# Patient Record
Sex: Female | Born: 1990 | Race: Black or African American | Hispanic: No | Smoking: Never smoker
Health system: Southern US, Community
[De-identification: ages and names within clinical notes are randomized; demographics above are authoritative.]

---
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
## 2011-10-18 ENCOUNTER — Emergency Department: Payer: Self-pay | Admitting: Internal Medicine

## 2011-10-22 ENCOUNTER — Emergency Department: Payer: Self-pay | Admitting: Emergency Medicine

## 2012-07-22 ENCOUNTER — Emergency Department: Payer: Self-pay | Admitting: Emergency Medicine

## 2012-07-22 LAB — URINALYSIS, COMPLETE
Bilirubin,UR: NEGATIVE
Glucose,UR: NEGATIVE mg/dL (ref 0–75)
Ketone: NEGATIVE
Ph: 7 (ref 4.5–8.0)
Protein: NEGATIVE
Specific Gravity: 1.02 (ref 1.003–1.030)
Squamous Epithelial: 8

## 2012-07-22 LAB — WET PREP, GENITAL

## 2012-10-11 ENCOUNTER — Emergency Department: Payer: Self-pay | Admitting: Emergency Medicine

## 2012-11-07 ENCOUNTER — Ambulatory Visit (INDEPENDENT_AMBULATORY_CARE_PROVIDER_SITE_OTHER): Payer: Managed Care, Other (non HMO) | Admitting: Adult Health

## 2012-11-07 ENCOUNTER — Encounter: Payer: Self-pay | Admitting: Adult Health

## 2012-11-07 VITALS — BP 110/76 | HR 66 | Temp 98.1°F | Resp 12 | Ht 63.5 in | Wt 193.5 lb

## 2012-11-07 DIAGNOSIS — Z0000 Encounter for general adult medical examination without abnormal findings: Secondary | ICD-10-CM | POA: Insufficient documentation

## 2012-11-07 NOTE — Assessment & Plan Note (Addendum)
Normal physical exam. Check labs: cbc w/diff, tsh, bmet, hepatic panel, lipids. First PAP in February at the [facility]. Request records.

## 2012-11-07 NOTE — Progress Notes (Signed)
  Subjective:    Patient ID: Madison Shaw, female    DOB: 06/01/1990, 22 y.o.   MRN: [id number]  HPI  Pt is pleasant 22 yo female who presents to clinic to establish care. Pt states she has never had a PCP. Pt would go to urgent care for any acute concerns. Pt [facility] Will [facility].    History reviewed. No pertinent past medical history.   History reviewed. No pertinent past surgical history.   Family History  Problem Relation Age of Onset  . Hypertension Mother   . Diabetes Mother   . Diabetes Father   . Hypertension Father      History   Social History  . Marital Status: Divorced    Spouse Name: N/A    Number of Children: N/A  . Years of Education: 13   Occupational History  . Customer Service [employer]   Social History Main Topics  . Smoking status: Never Smoker   . Smokeless tobacco: Never Used  . Alcohol Use: No  . Drug Use: No  . Sexual Activity: Yes    Birth Control/ Protection: Condom   Other Topics Concern  . Not on file   Social History Narrative   Pt is 22 yo female, lives alone.  Pt works in Clinical biochemist at [employer], states she will soon be starting school and plans to pursue a nursing degree.  Pt enjoys shopping in her free time.     Review of Systems  Constitutional: Negative.   HENT:       Pt reports history of tonsil stones.  Eyes: Negative.   Respiratory: Negative.   Cardiovascular: Negative.   Gastrointestinal: Negative.   Genitourinary: Positive for menstrual problem.       Reports heavy, irregular menstrual cycles  Skin: Negative.   Psychiatric/Behavioral: Negative.        Objective:   Physical Exam  Constitutional: She is oriented to person, place, and time. She appears well-developed and well-nourished.  HENT:  Head: Normocephalic and atraumatic.  Right Ear: Tympanic membrane, external ear and ear canal normal.  Left Ear: Tympanic membrane, external ear and  ear canal normal.  Mouth/Throat: Oropharyngeal exudate present.  Small white patches noted on tonsils  Eyes: Pupils are equal, round, and reactive to light.  Neck: Normal range of motion. Neck supple.  Cardiovascular: Normal rate, regular rhythm, normal heart sounds and intact distal pulses.   Pulmonary/Chest: Effort normal and breath sounds normal.  Abdominal: Soft. Bowel sounds are normal.  Musculoskeletal: Normal range of motion.  Neurological: She is alert and oriented to person, place, and time. She has normal strength and normal reflexes. She displays a negative Romberg sign.  Skin: Skin is warm and dry.  Psychiatric: She has a normal mood and affect. Her behavior is normal. Judgment and thought content normal.    BP 110/76  Pulse 66  Temp(Src) 98.1 F (36.7 C) (Oral)  Resp 12  Ht 5' 3.5" (1.613 m)  Wt 193 lb 8 oz (87.771 kg)  BMI 33.74 kg/m2  SpO2 95%     Assessment & Plan:

## 2012-11-07 NOTE — Patient Instructions (Addendum)
   Thank you for choosing [place] [facility] for your health care needs.  Please have your labs drawn at [facility] and have the results sent to my office.  The results will be available through MyChart for your convenience. Please remember to activate this. The activation code is located at the end of this form.  Set up a follow up appointment for your health physical prior to school. Fill out as much of the form as possible prior to coming to the office and we will fill out the portion that corresponds to [place].

## 2012-11-08 ENCOUNTER — Telehealth: Payer: Self-pay | Admitting: Adult Health

## 2012-11-08 DIAGNOSIS — D75839 Thrombocytosis, unspecified: Secondary | ICD-10-CM

## 2012-11-08 DIAGNOSIS — D473 Essential (hemorrhagic) thrombocythemia: Secondary | ICD-10-CM

## 2012-11-08 NOTE — Telephone Encounter (Signed)
Slightly elevated platelet count. All other labs look good. Want to repeat a CBC in 2-3 months.

## 2012-11-08 NOTE — Telephone Encounter (Signed)
Called and advised patient of results

## 2012-11-21 ENCOUNTER — Encounter: Payer: Self-pay | Admitting: Adult Health

## 2013-01-09 ENCOUNTER — Encounter: Payer: Managed Care, Other (non HMO) | Admitting: Adult Health

## 2013-01-20 ENCOUNTER — Ambulatory Visit (INDEPENDENT_AMBULATORY_CARE_PROVIDER_SITE_OTHER): Payer: Self-pay | Admitting: Family Medicine

## 2013-01-20 ENCOUNTER — Encounter: Payer: Self-pay | Admitting: Family Medicine

## 2013-01-20 VITALS — BP 132/86 | HR 86 | Temp 98.3°F | Wt 201.2 lb

## 2013-01-20 DIAGNOSIS — N926 Irregular menstruation, unspecified: Secondary | ICD-10-CM

## 2013-01-20 LAB — POCT URINE PREGNANCY: Preg Test, Ur: NEGATIVE

## 2013-01-20 NOTE — Progress Notes (Signed)
Pre-visit discussion using our clinic review tool. No additional management support is needed unless otherwise documented below in the visit note.  LMP 12/02/12.  Normal period at the time.  Hasn't missed a period until now, usually regular.  About 3 weeks over.  No on birth control. Neg preg test at night about 5 days ago.  Breast are slightly sore, but this isn't new for patient.   She is safe at home.  Sexually active, unprotected.  Pap was normal per patient 2014.  No VB of any variety recently.  No cramping or abd pain.  No PMS sx. No FCNAVD.     Not smoking, no etoh, no illicits.    Meds, vitals, and allergies reviewed.   ROS: See HPI.  Otherwise, noncontributory.  nad ncat rrr ctab abd soft Ext w/o edema

## 2013-01-20 NOTE — Patient Instructions (Signed)
I would recheck a home test in about 4-5 days, in the morning.  If you need help, then notify your regular clinic. Take care.

## 2013-01-20 NOTE — Assessment & Plan Note (Addendum)
Repeat upreg neg here today.  Missed a single period.  Would recheck a home test in about 4-5 days, then fu with PCP if needed.  She agrees.  Continue MVI for now.  Would continue to avoid illicits, tob, etoh. She agrees.  Okay for outpatient f/u.

## 2013-06-21 ENCOUNTER — Encounter: Payer: Self-pay | Admitting: Adult Health

## 2014-10-17 ENCOUNTER — Telehealth: Payer: Self-pay | Admitting: *Deleted

## 2014-10-17 NOTE — Telephone Encounter (Signed)
Pt called requesting an appoint because she does not feel well today.  I attempted to return call to [phone] the line had been disconnected.  I also attempted to call number listed in chart as home/cell wrong number.  I then attempted to call emergency contact that number had been disconnected as well.

## 2014-10-18 ENCOUNTER — Ambulatory Visit: Payer: Self-pay | Admitting: Nurse Practitioner

## 2014-11-06 ENCOUNTER — Encounter: Payer: Self-pay | Admitting: Nurse Practitioner

## 2014-11-06 ENCOUNTER — Ambulatory Visit (INDEPENDENT_AMBULATORY_CARE_PROVIDER_SITE_OTHER): Payer: BC Managed Care – PPO | Admitting: Nurse Practitioner

## 2014-11-06 VITALS — BP 118/90 | HR 76 | Temp 98.6°F | Resp 14 | Ht 64.0 in | Wt 190.0 lb

## 2014-11-06 DIAGNOSIS — Z0000 Encounter for general adult medical examination without abnormal findings: Secondary | ICD-10-CM | POA: Diagnosis not present

## 2014-11-06 DIAGNOSIS — Z113 Encounter for screening for infections with a predominantly sexual mode of transmission: Secondary | ICD-10-CM

## 2014-11-06 DIAGNOSIS — Z01419 Encounter for gynecological examination (general) (routine) without abnormal findings: Secondary | ICD-10-CM

## 2014-11-06 DIAGNOSIS — N926 Irregular menstruation, unspecified: Secondary | ICD-10-CM | POA: Diagnosis not present

## 2014-11-06 DIAGNOSIS — Z8639 Personal history of other endocrine, nutritional and metabolic disease: Secondary | ICD-10-CM

## 2014-11-06 LAB — COMPREHENSIVE METABOLIC PANEL
ALK PHOS: 64 U/L (ref 39–117)
ALT: 11 U/L (ref 0–35)
AST: 14 U/L (ref 0–37)
Albumin: 4.4 g/dL (ref 3.5–5.2)
BILIRUBIN TOTAL: 0.4 mg/dL (ref 0.2–1.2)
BUN: 9 mg/dL (ref 6–23)
CO2: 29 meq/L (ref 19–32)
CREATININE: 0.72 mg/dL (ref 0.40–1.20)
Calcium: 9.6 mg/dL (ref 8.4–10.5)
Chloride: 102 mEq/L (ref 96–112)
GFR: 128.18 mL/min (ref >60.00)
GLUCOSE: 84 mg/dL (ref 70–99)
Potassium: 3.8 mEq/L (ref 3.5–5.1)
Sodium: 138 mEq/L (ref 135–145)
TOTAL PROTEIN: 7.3 g/dL (ref 6.0–8.3)

## 2014-11-06 LAB — LIPID PANEL
Cholesterol: 149 mg/dL (ref 0–200)
HDL: 48.5 mg/dL (ref >39.00)
LDL CALC: 92 mg/dL (ref 0–99)
NONHDL: 100.5
Total CHOL/HDL Ratio: 3
Triglycerides: 43 mg/dL (ref 0.0–149.0)
VLDL: 8.6 mg/dL (ref 0.0–40.0)

## 2014-11-06 LAB — CBC WITH DIFFERENTIAL/PLATELET
Basophils Absolute: 0 10*3/uL (ref 0.0–0.1)
Basophils Relative: 0.3 % (ref 0.0–3.0)
EOS ABS: 0.1 10*3/uL (ref 0.0–0.7)
Eosinophils Relative: 0.9 % (ref 0.0–5.0)
HEMATOCRIT: 35.2 % — AB (ref 36.0–46.0)
Hemoglobin: 11.6 g/dL — ABNORMAL LOW (ref 12.0–15.0)
LYMPHS ABS: 3.1 10*3/uL (ref 0.7–4.0)
LYMPHS PCT: 53.9 % — AB (ref 12.0–46.0)
MCHC: 32.9 g/dL (ref 30.0–36.0)
MCV: 86 fl (ref 78.0–100.0)
MONOS PCT: 5 % (ref 3.0–12.0)
Monocytes Absolute: 0.3 10*3/uL (ref 0.1–1.0)
NEUTROS ABS: 2.3 10*3/uL (ref 1.4–7.7)
NEUTROS PCT: 39.9 % — AB (ref 43.0–77.0)
PLATELETS: 380 10*3/uL (ref 150.0–400.0)
RBC: 4.1 Mil/uL (ref 3.87–5.11)
RDW: 15 % (ref 11.5–15.5)
WBC: 5.8 10*3/uL (ref 4.0–10.5)

## 2014-11-06 LAB — HEMOGLOBIN A1C: Hgb A1c MFr Bld: 5 % (ref 4.6–6.5)

## 2014-11-06 LAB — POCT URINE PREGNANCY: PREG TEST UR: NEGATIVE

## 2014-11-06 LAB — T4, FREE: FREE T4: 0.9 ng/dL (ref 0.60–1.60)

## 2014-11-06 LAB — TSH: TSH: 1.09 u[IU]/mL (ref 0.35–4.50)

## 2014-11-06 NOTE — Patient Instructions (Signed)

## 2014-11-06 NOTE — Progress Notes (Signed)
Pre visit review using our clinic review tool, if applicable. No additional management support is needed unless otherwise documented below in the visit note. 

## 2014-11-06 NOTE — Progress Notes (Signed)
Patient ID: Madison Shaw, female    DOB: Apr 11, 1990  Age: 24 y.o. MRN: [id number]  CC: Annual Exam   HPI Madison Shaw presents for annual physical and CC of irregular menstruation.   1) Health Maintenance-   Diet- Healthy meals with portions, 1800 cal a day goal  Exercise- 2-3 hrs 6 days a week    Lost 30 lbs in 3 months  Immunizations- Flu last Friday   Pap- Due next year  Eye Exam- UTD  Dental Exam- UTD  STD testing requested-    LMP- Last week 10/28/14  2) Acute Problems-  Irregular Menses- 1 period since May  Lasted 1 week, heavy, throwing up, passing out, first two days very sick, bad cramps  Hair growth chest, under chin, gained a lot of weight last year since she was in school.   Uses condoms inconsistently- June last sexual encounter          History Madison Shaw has no past medical history on file.   She has no past surgical history on file.   Her family history includes Diabetes in her father and mother; Hypertension in her father and mother.She reports that she has never smoked. She has never used smokeless tobacco. She reports that she drinks alcohol. She reports that she does not use illicit drugs.  Outpatient Prescriptions Prior to Visit  Medication Sig Dispense Refill  . Multiple Vitamin (MULTIVITAMIN) capsule Take 1 capsule by mouth daily.     No facility-administered medications prior to visit.    ROS Review of Systems  Constitutional: Negative for fever, chills, diaphoresis, fatigue and unexpected weight change.  HENT: Negative for tinnitus and trouble swallowing.   Gastrointestinal: Negative for nausea, vomiting, abdominal pain, diarrhea, constipation and blood in stool.  Endocrine: Negative for polydipsia, polyphagia and polyuria.  Genitourinary: Negative for dysuria, hematuria, vaginal discharge and vaginal pain.  Musculoskeletal: Negative for myalgias, back pain, arthralgias and gait problem.  Skin: Negative for color change and rash.   Neurological: Negative for dizziness, weakness, numbness and headaches.  Hematological: Does not bruise/bleed easily.  Psychiatric/Behavioral: Negative for suicidal ideas and sleep disturbance. The patient is not nervous/anxious.     Objective:  BP 118/90 mmHg  Pulse 76  Temp(Src) 98.6 F (37 C)  Resp 14  Ht 5\' 4"  (1.626 m)  Wt 190 lb (86.183 kg)  BMI 32.60 kg/m2  SpO2 99%  Physical Exam  Constitutional: She is oriented to person, place, and time. She appears well-developed and well-nourished. No distress.  HENT:  Head: Normocephalic and atraumatic.  Right Ear: External ear normal.  Left Ear: External ear normal.  Nose: Nose normal.  Mouth/Throat: Oropharynx is clear and moist. No oropharyngeal exudate.  TMs and canals clear bilaterally  Eyes: Conjunctivae and EOM are normal. Pupils are equal, round, and reactive to light. Right eye exhibits no discharge. Left eye exhibits no discharge. No scleral icterus.  Neck: Normal range of motion. Neck supple. No thyromegaly present.  Cardiovascular: Normal rate, regular rhythm and normal heart sounds.   Pulmonary/Chest: Effort normal and breath sounds normal. No respiratory distress. She has no wheezes. She has no rales. She exhibits no tenderness.  Abdominal: Soft. Bowel sounds are normal. She exhibits no distension and no mass. There is no tenderness. There is no rebound and no guarding.  Musculoskeletal: Normal range of motion. She exhibits no edema or tenderness.  Lymphadenopathy:    She has no cervical adenopathy.  Neurological: She is alert and oriented to person, place, and  time. She has normal reflexes. No cranial nerve deficit. She exhibits normal muscle tone. Coordination normal.  Skin: Skin is warm and dry. No rash noted. She is not diaphoretic. No erythema. No pallor.  Psychiatric: She has a normal mood and affect. Her behavior is normal. Judgment and thought content normal.   Assessment & Plan:   Madison Shaw was seen today for  annual exam.  Diagnoses and all orders for this visit:  Encounter for routine gynecological examination -     POCT urine pregnancy  Routine general medical examination at a health care facility -     CBC with Differential/Platelet -     Comprehensive metabolic panel -     Hemoglobin A1c -     Lipid panel -     TSH  H/O thyroid disease -     T4, free  Screen for STD (sexually transmitted disease) -     STD Panel (HBSAG,HIV,RPR) -     GC/chlamydia probe amp, urine  Missed period   I am having Ms. Madison Shaw maintain her multivitamin.  No orders of the defined types were placed in this encounter.     Follow-up: Return in about 4 weeks (around 12/04/2014).

## 2014-11-07 ENCOUNTER — Other Ambulatory Visit: Payer: Self-pay | Admitting: Nurse Practitioner

## 2014-11-07 LAB — GC/CHLAMYDIA PROBE AMP, URINE
Chlamydia, Swab/Urine, PCR: NEGATIVE
GC Probe Amp, Urine: NEGATIVE

## 2014-11-07 LAB — STD PANEL
HIV 1&2 Ab, 4th Generation: NONREACTIVE
Hepatitis B Surface Ag: NEGATIVE

## 2014-11-07 MED ORDER — NORETHIN ACE-ETH ESTRAD-FE 1.5-30 MG-MCG PO TABS: 1.0000 | ORAL_TABLET | Freq: Every day | ORAL | Status: DC

## 2014-11-09 ENCOUNTER — Encounter: Payer: Self-pay | Admitting: Nurse Practitioner

## 2014-11-09 NOTE — Assessment & Plan Note (Addendum)
POCT urine pregnancy obtained. Possibly due to PCOS due to acne and hirsutism and irregular periods

## 2014-11-09 NOTE — Assessment & Plan Note (Signed)
Discussed acute and chronic issues. Reviewed health maintenance measures, PFSHx, and immunizations. Obtain routine labs TSH, Lipid panel, CBC w/ diff, A1c, and CMET.   STD panel is requested. Will add gc/chlamydia urine.

## 2015-01-23 ENCOUNTER — Encounter: Payer: Self-pay | Admitting: Nurse Practitioner

## 2015-01-25 ENCOUNTER — Other Ambulatory Visit: Payer: Self-pay | Admitting: Nurse Practitioner

## 2015-01-25 MED ORDER — NORETHIN ACE-ETH ESTRAD-FE 1.5-30 MG-MCG PO TABS: 1.0000 | ORAL_TABLET | Freq: Every day | ORAL | Status: DC

## 2015-02-04 ENCOUNTER — Encounter: Payer: Self-pay | Admitting: Nurse Practitioner

## 2015-02-04 NOTE — Telephone Encounter (Signed)
Please advise./tvw 

## 2015-02-05 ENCOUNTER — Telehealth: Payer: Self-pay | Admitting: *Deleted

## 2015-02-05 NOTE — Telephone Encounter (Signed)
Patient was last seen in Sept of this year please advise for labs?

## 2015-02-05 NOTE — Telephone Encounter (Signed)
Patient requested to have HPV testing done. She was not sure of the specifics of testing that could be done. Please Advise  She requested orders for testing to be put into epic,so that she could have labs drawn at her office.

## 2015-02-05 NOTE — Telephone Encounter (Signed)
I have responded to the mychart message she sent with additional questions. If she could respond back to that it would be helpful

## 2015-02-14 ENCOUNTER — Ambulatory Visit: Payer: BC Managed Care – PPO | Admitting: Nurse Practitioner

## 2016-03-26 ENCOUNTER — Emergency Department: Payer: BLUE CROSS/BLUE SHIELD

## 2016-03-26 ENCOUNTER — Emergency Department
Admission: EM | Admit: 2016-03-26 | Discharge: 2016-03-26 | Disposition: A | Discharge status: Home / Self Care | Payer: BLUE CROSS/BLUE SHIELD | Attending: Emergency Medicine | Admitting: Emergency Medicine

## 2016-03-26 DIAGNOSIS — Y999 Unspecified external cause status: Secondary | ICD-10-CM | POA: Insufficient documentation

## 2016-03-26 DIAGNOSIS — Y9389 Activity, other specified: Secondary | ICD-10-CM | POA: Insufficient documentation

## 2016-03-26 DIAGNOSIS — M7918 Myalgia, other site: Secondary | ICD-10-CM

## 2016-03-26 DIAGNOSIS — S4991XA Unspecified injury of right shoulder and upper arm, initial encounter: Secondary | ICD-10-CM | POA: Diagnosis present

## 2016-03-26 DIAGNOSIS — Y92410 Unspecified street and highway as the place of occurrence of the external cause: Secondary | ICD-10-CM | POA: Insufficient documentation

## 2016-03-26 DIAGNOSIS — M791 Myalgia: Secondary | ICD-10-CM | POA: Diagnosis not present

## 2016-03-26 MED ORDER — IBUPROFEN 600 MG PO TABS
600.0000 mg | ORAL_TABLET | Freq: Once | ORAL | Status: AC
  Administered 2016-03-26: 600 mg via ORAL
  Filled 2016-03-26: qty 1
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
## 2016-03-26 NOTE — ED Notes (Signed)
Pt ambulatory to stat desk, pt arrives by POV. Pt alert and oriented X4, active, cooperative, pt in NAD. RR even and unlabored, color WNL.
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
## 2016-04-14 ENCOUNTER — Telehealth: Payer: Self-pay | Admitting: *Deleted

## 2016-04-14 ENCOUNTER — Encounter: Payer: Self-pay | Admitting: Physician Assistant

## 2016-04-14 ENCOUNTER — Telehealth: Payer: Self-pay | Admitting: Physician Assistant

## 2016-04-14 ENCOUNTER — Ambulatory Visit (INDEPENDENT_AMBULATORY_CARE_PROVIDER_SITE_OTHER): Payer: BLUE CROSS/BLUE SHIELD | Admitting: Physician Assistant

## 2016-04-14 VITALS — BP 126/90 | HR 72 | Temp 98.6°F | Ht 62.0 in | Wt 174.5 lb

## 2016-04-14 DIAGNOSIS — N946 Dysmenorrhea, unspecified: Secondary | ICD-10-CM

## 2016-04-14 LAB — POC URINALSYSI DIPSTICK (AUTOMATED)
Bilirubin, UA: NEGATIVE
Glucose, UA: NEGATIVE
Ketones, UA: 5
Nitrite, UA: NEGATIVE
PROTEIN UA: 300
RBC UA: 200
Urobilinogen, UA: 2
pH, UA: 5.5

## 2016-04-14 LAB — CBC WITH DIFFERENTIAL/PLATELET
BASOS ABS: 0 10*3/uL (ref 0.0–0.1)
Basophils Relative: 0.4 % (ref 0.0–3.0)
Eosinophils Absolute: 0 10*3/uL (ref 0.0–0.7)
Eosinophils Relative: 0.6 % (ref 0.0–5.0)
HCT: 36.5 % (ref 36.0–46.0)
Hemoglobin: 11.9 g/dL — ABNORMAL LOW (ref 12.0–15.0)
LYMPHS ABS: 2.2 10*3/uL (ref 0.7–4.0)
Lymphocytes Relative: 43 % (ref 12.0–46.0)
MCHC: 32.5 g/dL (ref 30.0–36.0)
MCV: 87.8 fl (ref 78.0–100.0)
MONO ABS: 0.2 10*3/uL (ref 0.1–1.0)
Monocytes Relative: 4.4 % (ref 3.0–12.0)
NEUTROS PCT: 51.6 % (ref 43.0–77.0)
Neutro Abs: 2.7 10*3/uL (ref 1.4–7.7)
Platelets: 345 10*3/uL (ref 150.0–400.0)
RBC: 4.16 Mil/uL (ref 3.87–5.11)
RDW: 13.8 % (ref 11.5–15.5)
WBC: 5.2 10*3/uL (ref 4.0–10.5)

## 2016-04-14 LAB — POCT URINE PREGNANCY: PREG TEST UR: NEGATIVE

## 2016-04-14 MED ORDER — ONDANSETRON HCL 4 MG PO TABS: 4.0000 mg | ORAL_TABLET | Freq: Three times a day (TID) | ORAL | 0 refills | Status: AC | PRN

## 2016-04-14 MED ORDER — MELOXICAM 7.5 MG PO TABS: 7.5000 mg | ORAL_TABLET | Freq: Every day | ORAL | 0 refills | Status: DC

## 2016-04-14 MED ORDER — ONDANSETRON HCL 4 MG PO TABS: 4.0000 mg | ORAL_TABLET | Freq: Three times a day (TID) | ORAL | 0 refills | Status: DC | PRN

## 2016-04-14 MED ORDER — MELOXICAM 7.5 MG PO TABS: 7.5000 mg | ORAL_TABLET | Freq: Every day | ORAL | 0 refills | Status: AC

## 2016-04-14 NOTE — Telephone Encounter (Signed)
Spoke to pt, told her [facility] [address]. Pt verbalized understanding.   Call[facility] [address] [facility] in [place] and spoke to Dowling, told him to cancel Rx's for Meloxicam and Zofran [facility]. Lanny Hurst verbalized understanding.

## 2016-04-14 NOTE — Telephone Encounter (Signed)
Patient called asking perscription called in this morning to be sent to different pharmacy.  [facility] instead [facility]-   [id number] [address], [place] (Phone) [phone] (Fax)

## 2016-04-14 NOTE — Progress Notes (Signed)
Subjective:    Patient ID: Madison Shaw, female    DOB: [birth year], 26 y.o.   MRN: [id number]  HPI  Ms. Madison Shaw is a 26 y/o female who presents for evaluation of severe menstrual cramps. Pain is currently 8/10. She reports that she has a history of severe abdominal cramps. She states that she has yet to see a gynecologist about this but she is interested in a referral. Since Friday she has has had intermittent pain that does not radiate. She has intermittent n/v/d. She is drinking well but has poor appetite. She has tried Extra Strength Tylenol with some relief. She has tried Aleve in the past without relief. She also uses heating pads which help. She is sexually active with her boyfriend and she does not use birth control or condoms. She is currently on her period. Last period prior to this was 03/08/16. Cycle length has been very regular for her, she keeps track of this on her phone. She denies fevers, chills, urinary symptoms. She states that prior to starting her period she had a white vaginal discharge.  Review of Systems  See HPI  No past medical history on file.   Social History   Social History  . Marital status: Divorced    Spouse name: N/A  . Number of children: N/A  . Years of education: 70   Occupational History  . [employer]   Social History Main Topics  . Smoking status: Never Smoker  . Smokeless tobacco: Never Used  . Alcohol use 0.0 oz/week     Comment: Rare  . Drug use: No  . Sexual activity: Yes    Partners: Male    Birth control/ protection: Condom     Comment: 2 partners this year    Other Topics Concern  . Not on file   Social History Narrative   Pt is 26 yo female, lives with Grandparents.  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 States she has started school and plans to pursue a nursing degree.  Pt enjoys shopping in her free time.   Caffeine- 16 oz tumbler 4 x a day of coffee, no soda, green tea      No past surgical history on file.  Family History  Problem Relation Age of Onset  . Hypertension Mother   . Diabetes Mother   . Diabetes Father   . Hypertension Father     Allergies  Allergen Reactions  . Hydrocortisone Rash    Current Outpatient Prescriptions on File Prior to Visit  Medication Sig Dispense Refill  . Multiple Vitamin (MULTIVITAMIN) capsule Take 1 capsule by mouth daily.     No current facility-administered medications on file prior to visit.     BP 126/90 (BP Location: Left Arm, Patient Position: Sitting, Cuff Size: Normal)   Pulse 72   Temp 98.6 F (37 C) (Oral)   Ht 5\' 2"  (1.575 m)   Wt 174 lb 8 oz (79.2 kg)   LMP 04/13/2016   BMI 31.92 kg/m       Objective:   Physical Exam  Constitutional: Vital signs are normal. She appears well-developed. She is cooperative.  Non-toxic appearance. She does not have a sickly appearance. She does not appear ill. No distress.  Cardiovascular: Normal rate, regular rhythm and normal heart sounds.   Pulmonary/Chest: Effort normal and breath sounds normal. No accessory muscle usage. No respiratory distress.  Abdominal: Soft. Normal appearance and bowel sounds are normal. There  is generalized tenderness. There is no rigidity, no rebound, no guarding and no tenderness at McBurney's point.  Generalized tenderness, but mostly in bilateral lower quadrants  Neurological: She is alert.  Skin: Skin is warm, dry and intact.  Nursing note and vitals reviewed.  Results for orders placed or performed in visit on 04/14/16  POCT Urinalysis Dipstick (Automated)  Result Value Ref Range   Color, UA Red    Clarity, UA Turbid    Glucose, UA Negative    Bilirubin, UA Negative    Ketones, UA 5    Spec Grav, UA >=1.030    Blood, UA 200    pH, UA 5.5    Protein, UA 300    Urobilinogen, UA 2.0    Nitrite, UA Negative    Leukocytes, UA moderate (2+) (A) Negative  POCT urine pregnancy  Result Value Ref Range   Preg Test, Ur  Negative Negative       Assessment & Plan:  1. Dysmenorrhea UA essentially unremarkable and she is without urinary symptoms. Urine pregnancy negative. Referral placed for ob-gyn.  Discussed with patient that I did not perform wet prep as she is having heavy bleeding and I do not think I could get an adequate sample to assess, however if she has discharge after period, she should see her PCP if unable to get in with ob-gyn soon. Stat CBC to assess for possible infectious process. Meloxicam per orders to help with cramps, take daily with food. Zofran prn for nausea. Discussed with patient that if her pain changes in any way, she should go to the ER for further evaluation. - POCT Urinalysis Dipstick (Automated) - POCT urine pregnancy - CBC with Differential/Platelet - Ambulatory referral to Obstetrics / Gynecology   Inda Coke PA-C 04/14/16

## 2016-04-14 NOTE — Progress Notes (Signed)
Pre visit review using our clinic review tool, if applicable. No additional management support is needed unless otherwise documented below in the visit note. 

## 2016-04-14 NOTE — Patient Instructions (Addendum)
It was great meeting you today!  Please start the Meloxicam, you may take once daily. Take with food to prevent GI upset.  If you develop any worsening pain, please go to the emergency room.  Dysmenorrhea Menstrual cramps (dysmenorrhea) are caused by the muscles of the uterus tightening (contracting) during a menstrual period. For some women, this discomfort is merely bothersome. For others, dysmenorrhea can be severe enough to interfere with everyday activities for a few days each month. Primary dysmenorrhea is menstrual cramps that last a couple of days when you start having menstrual periods or soon after. This often begins after a teenager starts having her period. As a woman gets older or has a baby, the cramps will usually lessen or disappear. Secondary dysmenorrhea begins later in life, lasts longer, and the pain may be stronger than primary dysmenorrhea. The pain may start before the period and last a few days after the period. What are the causes? Dysmenorrhea is usually caused by an underlying problem, such as:  The tissue lining the uterus grows outside of the uterus in other areas of the body (endometriosis).  The endometrial tissue, which normally lines the uterus, is found in or grows into the muscular walls of the uterus (adenomyosis).  The pelvic blood vessels are engorged with blood just before the menstrual period (pelvic congestive syndrome).  Overgrowth of cells (polyps) in the lining of the uterus or cervix.  Falling down of the uterus (prolapse) because of loose or stretched ligaments.  Depression.  Bladder problems, infection, or inflammation.  Problems with the intestine, a tumor, or irritable bowel syndrome.  Cancer of the female organs or bladder.  A severely tipped uterus.  A very tight opening or closed cervix.  Noncancerous tumors of the uterus (fibroids).  Pelvic inflammatory disease (PID).  Pelvic scarring (adhesions) from a previous  surgery.  Ovarian cyst.  An intrauterine device (IUD) used for birth control. What increases the risk? You may be at greater risk of dysmenorrhea if:  You are younger than age 19.  You started puberty early.  You have irregular or heavy bleeding.  You have never given birth.  You have a family history of this problem.  You are a smoker. What are the signs or symptoms?  Cramping or throbbing pain in your lower abdomen.  Headaches.  Lower back pain.  Nausea or vomiting.  Diarrhea.  Sweating or dizziness.  Loose stools. How is this diagnosed? A diagnosis is based on your history, symptoms, physical exam, diagnostic tests, or procedures. Diagnostic tests or procedures may include:  Blood tests.  Ultrasonography.  An examination of the lining of the uterus (dilation and curettage, D&C).  An examination inside your abdomen or pelvis with a scope (laparoscopy).  X-rays.  CT scan.  MRI.  An examination inside the bladder with a scope (cystoscopy).  An examination inside the intestine or stomach with a scope (colonoscopy, gastroscopy). How is this treated? Treatment depends on the cause of the dysmenorrhea. Treatment may include:  Pain medicine prescribed by your health care provider.  Birth control pills or an IUD with progesterone hormone in it.  Hormone replacement therapy.  Nonsteroidal anti-inflammatory drugs (NSAIDs). These may help stop the production of prostaglandins.  Surgery to remove adhesions, endometriosis, ovarian cyst, or fibroids.  Removal of the uterus (hysterectomy).  Progesterone shots to stop the menstrual period.  Cutting the nerves on the sacrum that go to the female organs (presacral neurectomy).  Electric current to the sacral nerves (sacral nerve  stimulation).  Antidepressant medicine.  Psychiatric therapy, counseling, or group therapy.  Exercise and physical therapy.  Meditation and yoga  therapy.  Acupuncture. Follow these instructions at home:  Only take over-the-counter or prescription medicines as directed by your health care provider.  Place a heating pad or hot water bottle on your lower back or abdomen. Do not sleep with the heating pad.  Use aerobic exercises, walking, swimming, biking, and other exercises to help lessen the cramping.  Massage to the lower back or abdomen may help.  Stop smoking.  Avoid alcohol and caffeine. Contact a health care provider if:  Your pain does not get better with medicine.  You have pain with sexual intercourse.  Your pain increases and is not controlled with medicines.  You have abnormal vaginal bleeding with your period.  You develop nausea or vomiting with your period that is not controlled with medicine. Get help right away if: You pass out. This information is not intended to replace advice given to you by your health care provider. Make sure you discuss any questions you have with your health care provider. Document Released: 02/02/2005 Document Revised: 07/11/2015 Document Reviewed: 07/21/2012 Elsevier Interactive Patient Education  2017 [employer].

## 2016-04-14 NOTE — Telephone Encounter (Signed)
Controlled substance: N/A  Pharmacy: N/A  Status (No new findings/new findings): No findings  New Findings (if applicable): None   Additional Comments: Provider requested review of data base not on any controlled substances.

## 2016-05-20 NOTE — Progress Notes (Deleted)
Pre visit review using our clinic review tool, if applicable. No additional management support is needed unless otherwise documented below in the visit note. 

## 2016-05-20 NOTE — Progress Notes (Deleted)
Subjective:    Ariahna Smiddy is a 26 y.o. female and is here for a comprehensive physical exam.  HPI  Health Maintenance Due  Topic Date Due  . PAP SMEAR  03/01/2015  . TETANUS/TDAP  04/17/2015    Acute Concerns:   Chronic Issues:   Health Maintenance: Immunizations -- *** Colonoscopy -- *** Mammogram -- *** PAP -- *** Bone Density -- *** Diet -- *** Caffeine intake -- *** Sleep habits -- *** Exercise -- *** Weight --    Mood -- ***  No flowsheet data found.   Other providers/specialists:   PMHx, SurgHx, SocialHx, Medications, and Allergies were reviewed in the Visit Navigator and updated as appropriate.   No past medical history on file.  No past surgical history on file.  Family History  Problem Relation Age of Onset  . Hypertension Mother   . Diabetes Mother   . Diabetes Father   . Hypertension Father     Social History  Substance Use Topics  . Smoking status: Never Smoker  . Smokeless tobacco: Never Used  . Alcohol use 0.0 oz/week     Comment: Rare    Review of Systems:   ROS  Objective:   There were no vitals taken for this visit.  General Appearance:    Alert, cooperative, no distress, appears stated age  Head:    Normocephalic, without obvious abnormality, atraumatic  Eyes:    PERRL, conjunctiva/corneas clear, EOM's intact, fundi    benign, both eyes  Ears:    Normal TM's and external ear canals, both ears  Nose:   Nares normal, septum midline, mucosa normal, no drainage    or sinus tenderness  Throat:   Lips, mucosa, and tongue normal; teeth and gums normal  Neck:   Supple, symmetrical, trachea midline, no adenopathy;    thyroid:  no enlargement/tenderness/nodules; no carotid   bruit or JVD  Back:     Symmetric, no curvature, ROM normal, no CVA tenderness  Lungs:     Clear to auscultation bilaterally, respirations unlabored  Chest Wall:    No tenderness or deformity   Heart:    Regular rate and rhythm, S1 and S2 normal,  no murmur, rub   or gallop  Breast Exam:    No tenderness, masses, or nipple abnormality  Abdomen:     Soft, non-tender, bowel sounds active all four quadrants,    no masses, no organomegaly  Genitalia:    Normal female without lesion, discharge or tenderness  Rectal:    Normal tone, normal prostate, no masses or tenderness;   guaiac negative stool  Extremities:   Extremities normal, atraumatic, no cyanosis or edema  Pulses:   2+ and symmetric all extremities  Skin:   Skin color, texture, turgor normal, no rashes or lesions  Lymph nodes:   Cervical, supraclavicular, and axillary nodes normal  Neurologic:   CNII-XII intact, normal strength, sensation and reflexes    throughout    Assessment/Plan:   There are no diagnoses linked to this encounter.  Well Adult Exam: Labs ordered: {Yes/No:18319::"Yes"}. Patient counseling was done. See below for items discussed. Discussed the patient's BMI. The BMI {BMI plan (MU NQF measure 421):19504} Follow up {follow-up interval:13814}.  Patient Counseling:   [x]     Nutrition: Stressed importance of moderation in sodium/caffeine intake, saturated fat and cholesterol, caloric balance, sufficient intake of fresh fruits, vegetables, fiber, calcium, iron, and 1 mg of folate supplement per day (for females capable of pregnancy).   [x]   Stressed the importance of regular exercise.    [x]     Substance Abuse: Discussed cessation/primary prevention of tobacco, alcohol, or other drug use; driving or other dangerous activities under the influence; availability of treatment for abuse.    [x]      Injury prevention: Discussed safety belts, safety helmets, smoke detector, smoking near bedding or upholstery.    [x]      Sexuality: Discussed sexually transmitted diseases, partner selection, use of condoms, avoidance of unintended pregnancy  and contraceptive alternatives.    [x]     Dental health: Discussed importance of regular tooth brushing, flossing, and  dental visits.   [x]      Health maintenance and immunizations reviewed. Please refer to Health maintenance section.    Inda Coke, PA-C [place]

## 2016-05-21 ENCOUNTER — Encounter: Payer: Self-pay | Admitting: Physician Assistant

## 2016-05-21 ENCOUNTER — Ambulatory Visit: Payer: BLUE CROSS/BLUE SHIELD | Admitting: Physician Assistant
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
## 2022-10-20 ENCOUNTER — Encounter: Payer: Medicaid Other | Admitting: Obstetrics and Gynecology

## 2022-10-20 ENCOUNTER — Encounter: Payer: Self-pay | Admitting: Obstetrics and Gynecology

## 2022-10-21 NOTE — Progress Notes (Signed)
Patient did not keep her new OB appointment for 10/20/2022.  Cornelia Copa MD [facility]ending [facility])

## 2023-03-31 ENCOUNTER — Ambulatory Visit: Payer: Medicaid Other | Admitting: Podiatry

## 2023-04-06 ENCOUNTER — Ambulatory Visit: Payer: Medicaid Other | Admitting: Podiatry

## 2023-05-10 ENCOUNTER — Ambulatory Visit: Admitting: Podiatry

## 2023-06-07 ENCOUNTER — Ambulatory Visit: Admitting: Podiatry

## 2023-06-09 ENCOUNTER — Ambulatory Visit (INDEPENDENT_AMBULATORY_CARE_PROVIDER_SITE_OTHER): Admitting: Podiatry

## 2023-06-09 DIAGNOSIS — Z91199 Patient's noncompliance with other medical treatment and regimen due to unspecified reason: Secondary | ICD-10-CM

## 2023-06-10 NOTE — Progress Notes (Signed)
 Patient was no-show for appointment today

## 2023-06-10 NOTE — Progress Notes (Signed)
 Beatryce L. Fittro Female, 33 y.o., [birth year] MRN: [id number] Phone: [phone] Mel[facility] Stable)  Floyce Hutching, DPM  This patient has had 5 no-shows in the last 8 weeks.  Please do not reschedule and notify them  Shelly/Anabell, can you please send letter. Thank you.

## 2023-07-06 ENCOUNTER — Encounter: Payer: Self-pay | Admitting: Podiatry
